# Patient Record
Sex: Female | Born: 1980 | Race: Black or African American | Hispanic: No | Marital: Single | Smoking: Current some day smoker
Health system: Southern US, Community
[De-identification: ages and names within clinical notes are randomized; demographics above are authoritative.]

---

## 2017-07-21 ENCOUNTER — Other Ambulatory Visit: Payer: Self-pay

## 2017-07-21 ENCOUNTER — Emergency Department: Payer: Medicaid Other

## 2017-07-21 ENCOUNTER — Emergency Department
Admission: EM | Admit: 2017-07-21 | Discharge: 2017-07-22 | Disposition: A | Discharge status: Home / Self Care | Payer: Medicaid Other | Attending: Emergency Medicine | Admitting: Emergency Medicine

## 2017-07-21 DIAGNOSIS — N3000 Acute cystitis without hematuria: Secondary | ICD-10-CM | POA: Diagnosis not present

## 2017-07-21 DIAGNOSIS — W19XXXA Unspecified fall, initial encounter: Secondary | ICD-10-CM | POA: Insufficient documentation

## 2017-07-21 DIAGNOSIS — R930 Abnormal findings on diagnostic imaging of skull and head, not elsewhere classified: Secondary | ICD-10-CM | POA: Insufficient documentation

## 2017-07-21 DIAGNOSIS — Y999 Unspecified external cause status: Secondary | ICD-10-CM | POA: Insufficient documentation

## 2017-07-21 DIAGNOSIS — R531 Weakness: Secondary | ICD-10-CM | POA: Diagnosis present

## 2017-07-21 DIAGNOSIS — S161XXA Strain of muscle, fascia and tendon at neck level, initial encounter: Secondary | ICD-10-CM | POA: Diagnosis not present

## 2017-07-21 DIAGNOSIS — Y9389 Activity, other specified: Secondary | ICD-10-CM | POA: Insufficient documentation

## 2017-07-21 DIAGNOSIS — Y929 Unspecified place or not applicable: Secondary | ICD-10-CM | POA: Diagnosis not present

## 2017-07-21 DIAGNOSIS — T148XXA Other injury of unspecified body region, initial encounter: Secondary | ICD-10-CM

## 2017-07-21 LAB — CBC
HCT: 38.4 % (ref 35.0–47.0)
Hemoglobin: 12.6 g/dL (ref 12.0–16.0)
MCH: 28 pg (ref 26.0–34.0)
MCHC: 32.8 g/dL (ref 32.0–36.0)
MCV: 85.3 fL (ref 80.0–100.0)
PLATELETS: 283 10*3/uL (ref 150–440)
RBC: 4.5 MIL/uL (ref 3.80–5.20)
RDW: 13.7 % (ref 11.5–14.5)
WBC: 6.8 10*3/uL (ref 3.6–11.0)

## 2017-07-21 LAB — COMPREHENSIVE METABOLIC PANEL
ALBUMIN: 4.5 g/dL (ref 3.5–5.0)
ALT: 16 U/L (ref 14–54)
AST: 23 U/L (ref 15–41)
Alkaline Phosphatase: 81 U/L (ref 38–126)
Anion gap: 7 (ref 5–15)
BUN: 11 mg/dL (ref 6–20)
CO2: 26 mmol/L (ref 22–32)
CREATININE: 0.76 mg/dL (ref 0.44–1.00)
Calcium: 9.4 mg/dL (ref 8.9–10.3)
Chloride: 104 mmol/L (ref 101–111)
GFR calc Af Amer: >60 mL/min (ref >60)
GFR calc non Af Amer: >60 mL/min (ref >60)
Glucose, Bld: 105 mg/dL — ABNORMAL HIGH (ref 65–99)
POTASSIUM: 3.3 mmol/L — AB (ref 3.5–5.1)
SODIUM: 137 mmol/L (ref 135–145)
Total Bilirubin: 0.7 mg/dL (ref 0.3–1.2)
Total Protein: 8 g/dL (ref 6.5–8.1)

## 2017-07-21 LAB — PROTIME-INR
INR: 0.93
Prothrombin Time: 12.4 seconds (ref 11.4–15.2)

## 2017-07-21 LAB — DIFFERENTIAL
BASOS ABS: 0 10*3/uL (ref 0–0.1)
BASOS PCT: 1 %
EOS ABS: 0.4 10*3/uL (ref 0–0.7)
Eosinophils Relative: 6 %
Lymphocytes Relative: 42 %
Lymphs Abs: 2.8 10*3/uL (ref 1.0–3.6)
Monocytes Absolute: 0.5 10*3/uL (ref 0.2–0.9)
Monocytes Relative: 7 %
NEUTROS ABS: 3.1 10*3/uL (ref 1.4–6.5)
NEUTROS PCT: 44 %

## 2017-07-21 LAB — TROPONIN I: Troponin I: <0.03 ng/mL (ref <0.03)

## 2017-07-21 LAB — APTT: APTT: 34 s (ref 24–36)

## 2017-07-21 MED ORDER — CYCLOBENZAPRINE HCL 10 MG PO TABS
10.0000 mg | ORAL_TABLET | Freq: Once | ORAL | Status: AC
  Administered 2017-07-22: 10 mg via ORAL
  Filled 2017-07-21: qty 1

## 2017-07-21 MED ORDER — KETOROLAC TROMETHAMINE 30 MG/ML IJ SOLN
15.0000 mg | Freq: Once | INTRAMUSCULAR | Status: AC
  Administered 2017-07-22: 15 mg via INTRAVENOUS
  Filled 2017-07-21: qty 1

## 2017-07-21 NOTE — ED Provider Notes (Signed)
[facility] Emergency Department Provider Note  ____________________________________________   First MD Initiated Contact with Patient 07/21/17 2306     (approximate)  I have reviewed the triage vital signs and the nursing notes.   HISTORY  Chief Complaint Weakness   HPI Nicole Banks is a 37 y.o. female who presents to the emergency department with left neck pain and left arm "heaviness" that began earlier on today.  Yesterday she was playing with her children in the mind and she slipped and fell onto her left side.  She had sudden onset pain in her left neck that initially improved however several hours ago developed numbness in her left arm.  She denies headache.  She denies double vision or blurred vision.  She has no history of stroke.  Nothing seemed to make her symptoms better and they are clearly worse with movement.  No past medical history on file.  There are no active problems to display for this patient.     Prior to Admission medications   Medication Sig Start Date End Date Taking? Authorizing Provider  aspirin EC 81 MG tablet Take 1 tablet (81 mg total) by mouth daily. 07/22/17 07/22/18  Merrily Brittleifenbark, Carder Yin, MD  cyclobenzaprine (FLEXERIL) 10 MG tablet Take 1 tablet (10 mg total) by mouth 3 (three) times daily as needed for muscle spasms. 07/22/17   Merrily Brittleifenbark, Tayley Mudrick, MD  nitrofurantoin, macrocrystal-monohydrate, (MACROBID) 100 MG capsule Take 1 capsule (100 mg total) by mouth 2 (two) times daily for 5 days. 07/22/17 07/27/17  Merrily Brittleifenbark, Yared Susan, MD    Allergies Patient has no known allergies.  No family history on file.  Social History Social History   Tobacco Use  . Smoking status: Not on file  Substance Use Topics  . Alcohol use: Not on file  . Drug use: Not on file    Review of Systems Constitutional: No fever/chills Eyes: No visual changes. ENT: No sore throat. Cardiovascular: Denies chest pain. Respiratory: Denies shortness of  breath. Gastrointestinal: No abdominal pain.  No nausea, no vomiting.  No diarrhea.  No constipation. Genitourinary: Negative for dysuria. Musculoskeletal: Negative for back pain. Skin: Negative for rash. Neurological: Positive for numbness   ____________________________________________   PHYSICAL EXAM:  VITAL SIGNS: ED Triage Vitals  Enc Vitals Group     BP 07/21/17 2252 (!) 121/57     Pulse Rate 07/21/17 2252 67     Resp 07/21/17 2252 18     Temp 07/21/17 2252 98.4 F (36.9 C)     Temp Source 07/21/17 2252 Oral     SpO2 07/21/17 2252 100 %     Weight 07/21/17 2253 137 lb (62.1 kg)     Height 07/21/17 2253 5\' 11"  (1.803 m)     Head Circumference --      Peak Flow --      Pain Score 07/21/17 2253 5     Pain Loc --      Pain Edu? --      Excl. in GC? --     Constitutional: Alert and oriented x4 pleasant cooperative speaks in full clear sentences no diaphoresis Eyes: PERRL EOMI. Head: Atraumatic. Nose: No congestion/rhinnorhea. Mouth/Throat: No trismus Neck: No stridor.   Cardiovascular: Normal rate, regular rhythm. Grossly normal heart sounds.  Good peripheral circulation. Respiratory: Normal respiratory effort.  No retractions. Lungs CTAB and moving good air Gastrointestinal: Soft nontender Musculoskeletal: No lower extremity edema   Neurologic:  Normal speech and language. No gross focal neurologic deficits are appreciated.  Skin:  Skin is warm, dry and intact. No rash noted. Psychiatric: Mood and affect are normal. Speech and behavior are normal.    ____________________________________________   DIFFERENTIAL includes but not limited to  Stroke, TIA, dissection, musculoskeletal strain ____________________________________________   LABS (all labs ordered are listed, but only abnormal results are displayed)  Labs Reviewed  COMPREHENSIVE METABOLIC PANEL - Abnormal; Notable for the following components:      Result Value   Potassium 3.3 (*)    Glucose, Bld  105 (*)    All other components within normal limits  URINE DRUG SCREEN, QUALITATIVE ([facility] ONLY) - Abnormal; Notable for the following components:   Cocaine Metabolite,Ur [place] POSITIVE (*)    All other components within normal limits  URINALYSIS, ROUTINE W REFLEX MICROSCOPIC - Abnormal; Notable for the following components:   Color, Urine YELLOW (*)    APPearance CLOUDY (*)    Leukocytes, UA MODERATE (*)    Bacteria, UA FEW (*)    Squamous Epithelial / LPF 6-30 (*)    All other components within normal limits  ETHANOL  PROTIME-INR  APTT  CBC  DIFFERENTIAL  TROPONIN I  POCT PREGNANCY, URINE    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  Code stroke was called in triage and the patient's head CT is negative for acute pathology.  I discussed with the telemetry neurologist who feels the patient's symptoms are most likely peripheral.  Her symptoms are improved after nonsteroidals and muscle relaxant.  Interestingly her head CT does show what is probably a remote infarct so I do think it is reasonable to give her a daily baby aspirin and have her follow-up with neurology for further stroke workup.  She verbalizes understanding and agreement the plan.  Strict return precautions have been given.      ____________________________________________   FINAL CLINICAL IMPRESSION(S) / ED  DIAGNOSES  Final diagnoses:  Acute cystitis without hematuria  Muscle strain      NEW MEDICATIONS STARTED DURING THIS VISIT:  Discharge Medication List as of 07/22/2017 12:42 AM    START taking these medications   Details  aspirin EC 81 MG tablet Take 1 tablet (81 mg total) by mouth daily., Starting Wed 07/22/2017, Until Thu 07/22/2018, Print    cyclobenzaprine (FLEXERIL) 10 MG tablet Take 1 tablet (10 mg total) by mouth 3 (three) times daily as needed for muscle spasms., Starting Wed 07/22/2017, Print    nitrofurantoin, macrocrystal-monohydrate, (MACROBID) 100 MG capsule Take 1 capsule (100 mg total) by mouth 2 (two) times daily for 5 days., Starting Wed 07/22/2017, Until Mon 07/27/2017, Print         Note:  This document was prepared using Dragon voice recognition software and may include unintentional dictation errors.     Merrily Brittle, MD 07/23/17 [phone]

## 2017-07-21 NOTE — ED Notes (Signed)
Pt states she fell yesterday in the rain yest, pt reports she fell on her left side.

## 2017-07-21 NOTE — ED Notes (Signed)
Dr. Gaynelle AduZaman (neurologist) talking with pt at this time

## 2017-07-21 NOTE — Consult Note (Signed)
[facility] TeleNeurology Consult Services  Asked to see this patient in telemedicine consultation. Consultation was performed with assistance of ancillary/medical staff at bedside.  Comments: Last Known normal  1800 Door Time: 2244 [facility] Contacted: 2313 [facility] first log in: 2325 NIHSS assessment time: 2331 Call back time: 2331 Needle Time: no iv tpa  HPI:  [age] yof with no significant past medical hx presents with left neck tenderness and left arm heaviness and discomfort. She was playing with her kids and slipped in the rain and fell on her knees and hands.  She denies any head or neck pain but states she fell on her left side.  She denies any weakness in her legs but states her arm feels odd.  She underwent ct scan head which was negative for acute process.   VSS  Gen Wn/Wd in Nad  TeleStroke Assessment: LOC:   0 LOC questions:  0 LOC Commands :   0 Gaze : 0 Visual fields :  0  Facial movements : 0 Upper limb Motor  0 Lower limb Motor  0 Limb Coordination  - 0 Sensory -  - 0 Language -  0 Speech -   0 Neglect / extinction -  0  NIHSS Score: 0    IMPRESSION  Suspect possible cervical paraspinal muscle spasms with radiculopathy related to fall as sxs isolated to LUE, can't exclude stroke but by hx and exam less likely. ? L cereballar remote infarct, patient with no past hx of stroke  Medical Decision Making:   Patient is not candidate for alteplase due to non focal exam and sxs onset greater than 4.5hrs  Not an IR candidate as low clinical suspicion for LVO by neurologic assessment.   Recommendations: - Recommend trial with muscle relaxer and nsaids for symptomatic relief, if patient is feeling better would recommend neurology follow up as outpatient - Thank you for allowing us to participate in the care of your patient, if there are any questions please don't hesitate to contact us  Discussed plan of care with patient/hospital staff    Physician: Terrace ArabiaMohammed Fedora Knisely, DO   [facility]

## 2017-07-21 NOTE — ED Notes (Signed)
Per Dr. Gaynelle AduZaman states pt is not having a stroke, will follow up with EDP Rifenbark.

## 2017-07-21 NOTE — ED Triage Notes (Addendum)
Pt in with co left sided pain and weakness no hx of the same. States at 1800 today, also states "swallowing" was different. No deficits noted at this time but pt states her left arm "feels weak at this time". Spoke with Dr. Manson PasseyBrown and instructed to call code stroke.

## 2017-07-22 LAB — URINALYSIS, ROUTINE W REFLEX MICROSCOPIC
BILIRUBIN URINE: NEGATIVE
Glucose, UA: NEGATIVE mg/dL
Hgb urine dipstick: NEGATIVE
KETONES UR: NEGATIVE mg/dL
Nitrite: NEGATIVE
PH: 7 (ref 5.0–8.0)
PROTEIN: NEGATIVE mg/dL
Specific Gravity, Urine: 1.006 (ref 1.005–1.030)

## 2017-07-22 LAB — URINE DRUG SCREEN, QUALITATIVE (ARMC ONLY)
AMPHETAMINES, UR SCREEN: NOT DETECTED
BENZODIAZEPINE, UR SCRN: NOT DETECTED
Barbiturates, Ur Screen: NOT DETECTED
COCAINE METABOLITE, UR ~~LOC~~: POSITIVE — AB
Cannabinoid 50 Ng, Ur ~~LOC~~: NOT DETECTED
MDMA (Ecstasy)Ur Screen: NOT DETECTED
METHADONE SCREEN, URINE: NOT DETECTED
Opiate, Ur Screen: NOT DETECTED
PHENCYCLIDINE (PCP) UR S: NOT DETECTED
Tricyclic, Ur Screen: NOT DETECTED

## 2017-07-22 LAB — POCT PREGNANCY, URINE: Preg Test, Ur: NEGATIVE

## 2017-07-22 LAB — ETHANOL: Alcohol, Ethyl (B): <10 mg/dL (ref <10)

## 2017-07-22 MED ORDER — NITROFURANTOIN MONOHYD MACRO 100 MG PO CAPS: 100.0000 mg | ORAL_CAPSULE | Freq: Two times a day (BID) | ORAL | 0 refills | Status: AC

## 2017-07-22 MED ORDER — ASPIRIN 81 MG PO CHEW
324.0000 mg | CHEWABLE_TABLET | Freq: Once | ORAL | Status: AC
  Administered 2017-07-22: 324 mg via ORAL
  Filled 2017-07-22: qty 4

## 2017-07-22 MED ORDER — CYCLOBENZAPRINE HCL 10 MG PO TABS: 10.0000 mg | ORAL_TABLET | Freq: Three times a day (TID) | ORAL | 0 refills | Status: AC | PRN

## 2017-07-22 MED ORDER — NITROFURANTOIN MONOHYD MACRO 100 MG PO CAPS
100.0000 mg | ORAL_CAPSULE | Freq: Once | ORAL | Status: AC
  Administered 2017-07-22: 100 mg via ORAL
  Filled 2017-07-22: qty 1

## 2017-07-22 MED ORDER — ASPIRIN EC 81 MG PO TBEC: 81.0000 mg | DELAYED_RELEASE_TABLET | Freq: Every day | ORAL | 0 refills | Status: AC

## 2017-07-22 NOTE — ED Notes (Signed)
Per EDP, code stroke cancelled.

## 2017-07-22 NOTE — ED Notes (Signed)

## 2017-07-22 NOTE — Discharge Instructions (Signed)
Please use your muscle relaxants as needed for severe symptoms and make an appointment to follow-up with the neurologist for reevaluation.  Return to the emergency department immediately for any new or worsening symptoms such as numbness, weakness, slurred speech, or for any other issues whatsoever.  Please begin taking 81 mg of aspirin every single day.  It was a pleasure to take care of you today, and thank you for coming to our emergency department.  If you have any questions or concerns before leaving please ask the nurse to grab me and I'm more than happy to go through your aftercare instructions again.  If you were prescribed any opioid pain medication today such as Norco, Vicodin, Percocet, morphine, hydrocodone, or oxycodone please make sure you do not drive when you are taking this medication as it can alter your ability to drive safely.  If you have any concerns once you are home that you are not improving or are in fact getting worse before you can make it to your follow-up appointment, please do not hesitate to call 911 and come back for further evaluation.  Merrily BrittleNeil Mahira Petras, MD  Results for orders placed or performed during the hospital encounter of 07/21/17  Ethanol  Result Value Ref Range   Alcohol, Ethyl (B) <10 <10 mg/dL  Protime-INR  Result Value Ref Range   Prothrombin Time 12.4 11.4 - 15.2 seconds   INR 0.93   APTT  Result Value Ref Range   aPTT 34 24 - 36 seconds  CBC  Result Value Ref Range   WBC 6.8 3.6 - 11.0 K/uL   RBC 4.50 3.80 - 5.20 MIL/uL   Hemoglobin 12.6 12.0 - 16.0 g/dL   HCT 40.938.4 81.135.0 - 91.447.0 %   MCV 85.3 80.0 - 100.0 fL   MCH 28.0 26.0 - 34.0 pg   MCHC 32.8 32.0 - 36.0 g/dL   RDW 78.213.7 95.611.5 - 21.314.5 %   Platelets 283 150 - 440 K/uL  Differential  Result Value Ref Range   Neutrophils Relative % 44 %   Neutro Abs 3.1 1.4 - 6.5 K/uL   Lymphocytes Relative 42 %   Lymphs Abs 2.8 1.0 - 3.6 K/uL   Monocytes Relative 7 %   Monocytes Absolute 0.5 0.2 - 0.9  K/uL   Eosinophils Relative 6 %   Eosinophils Absolute 0.4 0 - 0.7 K/uL   Basophils Relative 1 %   Basophils Absolute 0.0 0 - 0.1 K/uL  Comprehensive metabolic panel  Result Value Ref Range   Sodium 137 135 - 145 mmol/L   Potassium 3.3 (L) 3.5 - 5.1 mmol/L   Chloride 104 101 - 111 mmol/L   CO2 26 22 - 32 mmol/L   Glucose, Bld 105 (H) 65 - 99 mg/dL   BUN 11 6 - 20 mg/dL   Creatinine, Ser 0.860.76 0.44 - 1.00 mg/dL   Calcium 9.4 8.9 - 57.810.3 mg/dL   Total Protein 8.0 6.5 - 8.1 g/dL   Albumin 4.5 3.5 - 5.0 g/dL   AST 23 15 - 41 U/L   ALT 16 14 - 54 U/L   Alkaline Phosphatase 81 38 - 126 U/L   Total Bilirubin 0.7 0.3 - 1.2 mg/dL   GFR calc non Af Amer >60 >60 mL/min   GFR calc Af Amer >60 >60 mL/min   Anion gap 7 5 - 15  Troponin I  Result Value Ref Range   Troponin I <0.03 <0.03 ng/mL  Urine Drug Screen, Qualitative  Result Value Ref  Range   Tricyclic, Ur Screen NONE DETECTED NONE DETECTED   Amphetamines, Ur Screen NONE DETECTED NONE DETECTED   MDMA (Ecstasy)Ur Screen NONE DETECTED NONE DETECTED   Cocaine Metabolite,Ur  POSITIVE (A) NONE DETECTED   Opiate, Ur Screen NONE DETECTED NONE DETECTED   Phencyclidine (PCP) Ur S NONE DETECTED NONE DETECTED   Cannabinoid 50 Ng, Ur  NONE DETECTED NONE DETECTED   Barbiturates, Ur Screen NONE DETECTED NONE DETECTED   Benzodiazepine, Ur Scrn NONE DETECTED NONE DETECTED   Methadone Scn, Ur NONE DETECTED NONE DETECTED  Urinalysis, Routine w reflex microscopic  Result Value Ref Range   Color, Urine YELLOW (A) YELLOW   APPearance CLOUDY (A) CLEAR   Specific Gravity, Urine 1.006 1.005 - 1.030   pH 7.0 5.0 - 8.0   Glucose, UA NEGATIVE NEGATIVE mg/dL   Hgb urine dipstick NEGATIVE NEGATIVE   Bilirubin Urine NEGATIVE NEGATIVE   Ketones, ur NEGATIVE NEGATIVE mg/dL   Protein, ur NEGATIVE NEGATIVE mg/dL   Nitrite NEGATIVE NEGATIVE   Leukocytes, UA MODERATE (A) NEGATIVE   RBC / HPF 6-30 0 - 5 RBC/hpf   WBC, UA 6-30 0 - 5 WBC/hpf   Bacteria, UA  FEW (A) NONE SEEN   Squamous Epithelial / LPF 6-30 (A) NONE SEEN   WBC Clumps PRESENT   Pregnancy, urine POC  Result Value Ref Range   Preg Test, Ur NEGATIVE NEGATIVE   Ct Head Code Stroke Wo Contrast`  Result Date: 07/21/2017 CLINICAL DATA:  Code stroke. Initial evaluation for acute left-sided weakness. EXAM: CT HEAD WITHOUT CONTRAST TECHNIQUE: Contiguous axial images were obtained from the base of the skull through the vertex without intravenous contrast. COMPARISON:  None. FINDINGS: Brain: Cerebral volume within normal limits. No acute intracranial hemorrhage. No acute large vessel territory infarct. Small remote left cerebellar infarct noted. No mass lesion, midline shift or mass effect. No hydrocephalus. No extra-axial fluid collection. Vascular: No hyperdense vessel. Skull: Scalp soft tissues and calvarium within normal limits. Sinuses/Orbits: Globes and orbital soft tissues normal. Paranasal sinuses and mastoid air cells are clear. Other: None. ASPECTS [facility] Stroke Program Early CT Score) - Ganglionic level infarction (caudate, lentiform nuclei, internal capsule, insula, M1-M3 cortex): 7 - Supraganglionic infarction (M4-M6 cortex): 3 Total score (0-10 with 10 being normal): 10 IMPRESSION: 1. No acute intracranial infarct or other abnormality identified. 2. ASPECTS is 10. 3. Small remote left cerebellar infarct. Critical Value/emergent results were called by telephone at the time of interpretation on 07/21/2017 at 11:18 pm to Dr. Bayard Males , who verbally acknowledged these results. Electronically Signed   By: Rise Mu M.D.   On: 07/21/2017 23:19

## 2017-08-08 ENCOUNTER — Emergency Department
Admission: EM | Admit: 2017-08-08 | Discharge: 2017-08-08 | Disposition: A | Discharge status: Home / Self Care | Payer: Medicaid Other | Attending: Emergency Medicine | Admitting: Emergency Medicine

## 2017-08-08 ENCOUNTER — Other Ambulatory Visit: Payer: Self-pay

## 2017-08-08 ENCOUNTER — Encounter: Payer: Self-pay | Admitting: Emergency Medicine

## 2017-08-08 ENCOUNTER — Emergency Department: Payer: Medicaid Other

## 2017-08-08 DIAGNOSIS — S93602A Unspecified sprain of left foot, initial encounter: Secondary | ICD-10-CM | POA: Insufficient documentation

## 2017-08-08 DIAGNOSIS — Z79899 Other long term (current) drug therapy: Secondary | ICD-10-CM | POA: Insufficient documentation

## 2017-08-08 DIAGNOSIS — Y999 Unspecified external cause status: Secondary | ICD-10-CM | POA: Insufficient documentation

## 2017-08-08 DIAGNOSIS — Z7982 Long term (current) use of aspirin: Secondary | ICD-10-CM | POA: Diagnosis not present

## 2017-08-08 DIAGNOSIS — Y939 Activity, unspecified: Secondary | ICD-10-CM | POA: Diagnosis not present

## 2017-08-08 DIAGNOSIS — S99922A Unspecified injury of left foot, initial encounter: Secondary | ICD-10-CM | POA: Diagnosis present

## 2017-08-08 DIAGNOSIS — X501XXA Overexertion from prolonged static or awkward postures, initial encounter: Secondary | ICD-10-CM | POA: Diagnosis not present

## 2017-08-08 DIAGNOSIS — Y929 Unspecified place or not applicable: Secondary | ICD-10-CM | POA: Insufficient documentation

## 2017-08-08 MED ORDER — MELOXICAM 15 MG PO TABS: 15.0000 mg | ORAL_TABLET | Freq: Every day | ORAL | 0 refills | Status: AC

## 2017-08-08 MED ORDER — OXYCODONE-ACETAMINOPHEN 5-325 MG PO TABS
1.0000 | ORAL_TABLET | Freq: Once | ORAL | Status: AC
  Administered 2017-08-08: 1 via ORAL
  Filled 2017-08-08: qty 1

## 2017-08-08 NOTE — ED Notes (Signed)
See triage note  Presents with pain to left foot   States she was getting up from her bed  Her foot bent   States she did not twist her ankle but bent her toes back  Positive pulses  With some swelling

## 2017-08-08 NOTE — ED Provider Notes (Signed)
[facility] Emergency Department Provider Note  ____________________________________________  Time seen: Approximately 4:31 PM  I have reviewed the triage vital signs and the nursing notes.   HISTORY  Chief Complaint Foot Injury    HPI Nicole Banks is a 37 y.o. female who presents emergency department complaining of left foot pain.  Patient reports that she was getting out of bed, managed to bend the distal aspect of her foot underneath her as she was stepping out of bed.  Patient reports that she has had pain and swelling to the distal half of her left foot.  Area is painful to the point of causing difficulty with ambulation.  She has not tried any medications for this complaint prior to arrival.  No other complaints at this time.  No history of previous ankle or foot injury.  History reviewed. No pertinent past medical history.  There are no active problems to display for this patient.   History reviewed. No pertinent surgical history.  Prior to Admission medications   Medication Sig Start Date End Date Taking? Authorizing Provider  aspirin EC 81 MG tablet Take 1 tablet (81 mg total) by mouth daily. 07/22/17 07/22/18  Merrily Brittle, MD  cyclobenzaprine (FLEXERIL) 10 MG tablet Take 1 tablet (10 mg total) by mouth 3 (three) times daily as needed for muscle spasms. 07/22/17   Merrily Brittle, MD  meloxicam (MOBIC) 15 MG tablet Take 1 tablet (15 mg total) by mouth daily. 08/08/17   Jerred Zaremba, Delorise Royals, PA-C    Allergies Patient has no known allergies.  No family history on file.  Social History Social History   Tobacco Use  . Smoking status: Not on file  Substance Use Topics  . Alcohol use: Not on file  . Drug use: Not on file     Review of Systems  Constitutional: No fever/chills Eyes: No visual changes. No discharge ENT: No upper respiratory complaints. Cardiovascular: no chest pain. Respiratory: no cough. No SOB. Gastrointestinal: No  abdominal pain.  No nausea, no vomiting.  Musculoskeletal: Positive for left foot injury/pain Skin: Negative for rash, abrasions, lacerations, ecchymosis. Neurological: Negative for headaches, focal weakness or numbness. 10-point ROS otherwise negative.  ____________________________________________   PHYSICAL EXAM:  VITAL SIGNS: ED Triage Vitals [08/08/17 1534]  Enc Vitals Group     BP 129/67     Pulse Rate 96     Resp 18     Temp 98.2 F (36.8 C)     Temp Source Oral     SpO2 98 %     Weight 137 lb (62.1 kg)     Height  (1.803 m)     Head Circumference      Peak Flow      Pain Score 8     Pain Loc      Pain Edu?      Excl. in GC?      Constitutional: Alert and oriented. Well appearing and in no acute distress. Eyes: Conjunctivae are normal. PERRL. EOMI. Head: Atraumatic. Neck: No stridor.    Cardiovascular: Normal rate, regular rhythm. Normal S1 and S2.  Good peripheral circulation. Respiratory: Normal respiratory effort without tachypnea or retractions. Lungs CTAB. Good air entry to the bases with no decreased or absent breath sounds. Musculoskeletal: Full range of motion to all extremities. No gross deformities appreciated.  No gross deformity, edema, erythema, lacerations or abrasions on the left foot.  Patient has very tender to palpation diffusely over the distal aspect of the foot  with no specific point tenderness.  Tenderness includes all 5 metatarsals, all 5 phalanxes.  No palpable abnormality.  Dorsalis pedis pulse intact.  Sensation capillary refill intact all 5 digits. Neurologic:  Normal speech and language. No gross focal neurologic deficits are appreciated.  Skin:  Skin is warm, dry and intact. No rash noted. Psychiatric: Mood and affect are normal. Speech and behavior are normal. Patient exhibits appropriate insight and judgement.   ____________________________________________   LABS (all labs ordered are listed, but only abnormal results are  displayed)  Labs Reviewed - No data to display ____________________________________________  EKG   ____________________________________________  RADIOLOGY [name], personally viewed and evaluated these images (plain radiographs) as part of my medical decision making, as well as reviewing the written report by the radiologist.  Dg Foot Complete Left  Result Date: 08/08/2017 CLINICAL DATA:  Left foot pain after injury getting out of bed this morning. Initial encounter. EXAM: LEFT FOOT - COMPLETE 3+ VIEW COMPARISON:  None. FINDINGS: There is no evidence of fracture or dislocation. There is no evidence of arthropathy or other focal bone abnormality. Soft tissues are unremarkable. IMPRESSION: Negative. Electronically Signed   By: Marnee Spring M.D.   On: 08/08/2017 17:00    ____________________________________________    PROCEDURES  Procedure(s) performed:    Procedures    Medications  oxyCODONE-acetaminophen (PERCOCET/ROXICET) 5-325 MG per tablet 1 tablet (has no administration in time range)     ____________________________________________   INITIAL IMPRESSION / ASSESSMENT AND PLAN / ED COURSE  Pertinent labs & imaging results that were available during my care of the patient were reviewed by me and considered in my medical decision making (see chart for details).  Review of the [place] CSRS was performed in accordance of the [facility] prior to dispensing any controlled drugs.     Patient's diagnosis is consistent with left foot sprain.  Patient presents with pain to the left foot after injury this morning.  Exam is reassuring.  X-ray reveals no acute osseous abnormality.  No indication of ligament rupture.  No indication for further work-up.  She was given Ace bandage and crutches in the emergency department.. Patient will be discharged home with prescriptions for meloxicam. Patient is to follow up with primary care as needed or otherwise directed. Patient is  given ED precautions to return to the ED for any worsening or new symptoms.     ____________________________________________  FINAL CLINICAL IMPRESSION(S) / ED DIAGNOSES  Final diagnoses:  Sprain of left foot, initial encounter      NEW MEDICATIONS STARTED DURING THIS VISIT:  ED Discharge Orders        Ordered    meloxicam (MOBIC) 15 MG tablet  Daily     08/08/17 1742          This chart was dictated using voice recognition software/Dragon. Despite best efforts to proofread, errors can occur which can change the meaning. Any change was purely unintentional.    Racheal Patches, PA-C 08/08/17 1742    Arnaldo Natal, MD 08/13/17 1314

## 2017-08-08 NOTE — ED Triage Notes (Signed)
L foot pain since twisted under her during night.

## 2018-01-12 LAB — HIV ANTIBODY (ROUTINE TESTING W REFLEX): HIV: NONREACTIVE

## 2018-01-12 LAB — HM HIV SCREENING LAB: HM HIV Screening: NEGATIVE

## 2018-08-31 ENCOUNTER — Encounter: Payer: Self-pay | Admitting: Emergency Medicine

## 2018-08-31 ENCOUNTER — Emergency Department
Admission: EM | Admit: 2018-08-31 | Discharge: 2018-08-31 | Disposition: A | Discharge status: Home / Self Care | Payer: Medicaid Other | Attending: Emergency Medicine | Admitting: Emergency Medicine

## 2018-08-31 ENCOUNTER — Other Ambulatory Visit: Payer: Self-pay

## 2018-08-31 DIAGNOSIS — S61412A Laceration without foreign body of left hand, initial encounter: Secondary | ICD-10-CM | POA: Insufficient documentation

## 2018-08-31 DIAGNOSIS — S61512A Laceration without foreign body of left wrist, initial encounter: Secondary | ICD-10-CM | POA: Diagnosis not present

## 2018-08-31 DIAGNOSIS — Y999 Unspecified external cause status: Secondary | ICD-10-CM | POA: Diagnosis not present

## 2018-08-31 DIAGNOSIS — Z79899 Other long term (current) drug therapy: Secondary | ICD-10-CM | POA: Insufficient documentation

## 2018-08-31 DIAGNOSIS — W25XXXA Contact with sharp glass, initial encounter: Secondary | ICD-10-CM | POA: Diagnosis not present

## 2018-08-31 DIAGNOSIS — Y92029 Unspecified place in mobile home as the place of occurrence of the external cause: Secondary | ICD-10-CM | POA: Diagnosis not present

## 2018-08-31 DIAGNOSIS — Y9389 Activity, other specified: Secondary | ICD-10-CM | POA: Insufficient documentation

## 2018-08-31 DIAGNOSIS — F17210 Nicotine dependence, cigarettes, uncomplicated: Secondary | ICD-10-CM | POA: Insufficient documentation

## 2018-08-31 MED ORDER — IBUPROFEN 600 MG PO TABS: 600.0000 mg | ORAL_TABLET | Freq: Four times a day (QID) | ORAL | 0 refills | Status: AC | PRN

## 2018-08-31 MED ORDER — LIDOCAINE HCL (PF) 1 % IJ SOLN
INTRAMUSCULAR | Status: AC
  Administered 2018-08-31: 5 mL
  Filled 2018-08-31: qty 5

## 2018-08-31 MED ORDER — LIDOCAINE HCL (PF) 1 % IJ SOLN
5.0000 mL | Freq: Once | INTRAMUSCULAR | Status: AC
  Administered 2018-08-31: 5 mL

## 2018-08-31 MED ORDER — OXYCODONE-ACETAMINOPHEN 5-325 MG PO TABS
1.0000 | ORAL_TABLET | Freq: Once | ORAL | Status: AC
  Administered 2018-08-31: 04:00:00 1 via ORAL
  Filled 2018-08-31: qty 1

## 2018-08-31 NOTE — ED Triage Notes (Addendum)
Patient ambulatory to triage with steady gait, without difficulty, tearful, mask in place; +ETOH; pt reports was "trying to kill a brown recluse spider, hit the mirror and cut hand"; lac to left inner wrist and base of left middle finger with small amount bleeding; silver tone band removed from middle finger and placed in container and given back to pt; gauze dressing applied

## 2018-08-31 NOTE — Discharge Instructions (Addendum)
Keep laceration dry and clean. Wash with warm water and soap. Apply topical bacitracin. Protect from the sun to minimize scarring. You received 7 stitches that must be removed in 7 days.  Watch for signs of infection: pus, redness of the skin surrounding it, or fever. If these develop see your doctor or return to the ER for antibiotics.

## 2018-08-31 NOTE — ED Provider Notes (Signed)
[facility] Emergency Department Provider Note  ____________________________________________  Time seen: Approximately 3:49 AM  I have reviewed the triage vital signs and the nursing notes.   HISTORY  Chief Complaint Laceration   HPI Nicole Banks is a 38 y.o. female who presents for evaluation of laceration.  Patient reports that she saw a brown recluse spider inside her trailer home.  The spider was over a mirror.  She reports hitting the mirror with her hand.  The mirror broke and cut her left wrist and palm of her left hand.  She is complaining of severe sharp constant pain since this happened.  Last tetanus shot was a year ago.  No other injuries.  Patient was not bitten by the spider.  PMH None - reviewed  Past Surgical History:  Procedure Laterality Date  . CESAREAN SECTION     x 2 deliveries    Prior to Admission medications   Medication Sig Start Date End Date Taking? Authorizing Provider  cyclobenzaprine (FLEXERIL) 10 MG tablet Take 1 tablet (10 mg total) by mouth 3 (three) times daily as needed for muscle spasms. 07/22/17   Merrily Brittleifenbark, Neil, MD  ibuprofen (ADVIL) 600 MG tablet Take 1 tablet (600 mg total) by mouth every 6 (six) hours as needed. 08/31/18   Nita SickleVeronese, [place], MD  meloxicam (MOBIC) 15 MG tablet Take 1 tablet (15 mg total) by mouth daily. 08/08/17   Cuthriell, Delorise RoyalsJonathan D, PA-C    Allergies Patient has no known allergies.  No family history on file.  Social History Social History   Tobacco Use  . Smoking status: Current Some Day Smoker  . Smokeless tobacco: Never Used  Substance Use Topics  . Alcohol use: Not on file  . Drug use: Not on file    Review of Systems  Constitutional: Negative for fever. Eyes: Negative for visual changes. ENT: Negative for sore throat. Neck: No neck pain  Cardiovascular: Negative for chest pain. Respiratory: Negative for shortness of breath. Gastrointestinal: Negative for abdominal  pain, vomiting or diarrhea. Genitourinary: Negative for dysuria. Musculoskeletal: Negative for back pain. Skin: Negative for rash. + L wrist and hand lacerattion Neurological: Negative for headaches, weakness or numbness. Psych: No SI or HI  ____________________________________________   PHYSICAL EXAM:  VITAL SIGNS: ED Triage Vitals  Enc Vitals Group     BP 08/31/18 0158 (!) 132/91     Pulse Rate 08/31/18 0158 (!) 119     Resp 08/31/18 0158 20     Temp 08/31/18 0158 98.3 F (36.8 C)     Temp Source 08/31/18 0158 Oral     SpO2 08/31/18 0158 98 %     Weight 08/31/18 0157 137 lb (62.1 kg)     Height 08/31/18 0157 5\' 11"  (1.803 m)     Head Circumference --      Peak Flow --      Pain Score 08/31/18 0157 10     Pain Loc --      Pain Edu? --      Excl. in GC? --     Constitutional: Alert and oriented, anxious, pacing up and down the room, mildly intoxicated.  HEENT:      Head: Normocephalic and atraumatic.         Eyes: Conjunctivae are normal. Sclera is non-icteric.       Mouth/Throat: Mucous membranes are moist.       Neck: Supple with no signs of meningismus. Cardiovascular: Regular rate and rhythm. No murmurs, gallops, or  rubs. 2+ symmetrical distal pulses are present in all extremities. No JVD. Respiratory: Normal respiratory effort. Lungs are clear to auscultation bilaterally. No wheezes, crackles, or rhonchi.  Musculoskeletal: Superficial 2 cm L dorsal wrist laceration and superficial 2cm left palm laceration.  Neurovascularly intact, no foreign bodies, no tendon injury, no arterial bleed Neurologic: Normal speech and language. Face is symmetric. Moving all extremities. No gross focal neurologic deficits are appreciated. Skin: Skin is warm, dry and intact. No rash noted. Psychiatric: Mood and affect are normal. Speech and behavior are normal.  ____________________________________________   LABS (all labs ordered are listed, but only abnormal results are displayed)   Labs Reviewed - No data to display ____________________________________________  EKG  none  ____________________________________________  RADIOLOGY  none  ____________________________________________   PROCEDURES  Procedure(s) performed: yes .Marland KitchenLaceration Repair Date/Time: 08/31/2018 3:55 AM Performed by: Nita Sickle, MD Authorized by: Nita Sickle, MD   Consent:    Consent obtained:  Verbal   Consent given by:  Patient   Risks discussed:  Infection, pain, retained foreign body, poor cosmetic result and poor wound healing Anesthesia (see MAR for exact dosages):    Anesthesia method:  Local infiltration   Local anesthetic:  Lidocaine 1% w/o epi Laceration details:    Location:  Hand   Hand location:  L palm   Length (cm):  2 Repair type:    Repair type:  Simple Exploration:    Hemostasis achieved with:  Direct pressure   Wound exploration: entire depth of wound probed and visualized     Wound extent: no foreign bodies/material noted, no nerve damage noted, no tendon damage noted, no underlying fracture noted and no vascular damage noted     Contaminated: no   Treatment:    Area cleansed with:  Saline   Amount of cleaning:  Extensive   Irrigation solution:  Sterile saline   Visualized foreign bodies/material removed: no   Skin repair:    Repair method:  Sutures   Suture size:  6-0   Suture material:  Nylon   Suture technique:  Simple interrupted   Number of sutures:  4 Approximation:    Approximation:  Close Post-procedure details:    Dressing:  Sterile dressing   Patient tolerance of procedure:  Tolerated well, no immediate complications .Marland KitchenLaceration Repair Date/Time: 08/31/2018 3:56 AM Performed by: Nita Sickle, MD Authorized by: Nita Sickle, MD   Consent:    Consent obtained:  Verbal   Consent given by:  Patient   Risks discussed:  Infection, pain, retained foreign body, poor cosmetic result and poor wound healing Anesthesia  (see MAR for exact dosages):    Anesthesia method:  Local infiltration   Local anesthetic:  Lidocaine 1% w/o epi Laceration details:    Location:  Hand   Hand location:  L wrist   Length (cm):  2 Repair type:    Repair type:  Simple Exploration:    Hemostasis achieved with:  Direct pressure   Wound exploration: entire depth of wound probed and visualized     Wound extent: no foreign bodies/material noted, no tendon damage noted, no underlying fracture noted and no vascular damage noted     Contaminated: no   Treatment:    Area cleansed with:  Saline   Amount of cleaning:  Extensive   Irrigation solution:  Sterile saline   Visualized foreign bodies/material removed: no   Skin repair:    Repair method:  Sutures   Suture size:  6-0   Suture material:  Nylon   Suture technique:  Simple interrupted   Number of sutures:  3 Approximation:    Approximation:  Close Post-procedure details:    Dressing:  Sterile dressing   Patient tolerance of procedure:  Tolerated well, no immediate complications   Critical Care performed:  None ____________________________________________   INITIAL IMPRESSION / ASSESSMENT AND PLAN / ED COURSE   38 y.o. female who presents for evaluation of laceration.  Patient with 2 x 2 cm lacerations in the left wrist and left palm which were repaired per procedure note above.  No foreign bodies.  Laceration superficial with no tendon or vasculature involvement.  No fractures.  Tetanus up-to-date.  Discussed wound care with patient and follow-up for stitch removal.      As part of my medical decision making, I reviewed the following data within the electronic MEDICAL RECORD NUMBER Nursing notes reviewed and incorporated, Old chart reviewed, Notes from prior ED visits and  Controlled Substance Database    Pertinent labs & imaging results that were available during my care of the patient were reviewed by me and considered in my medical decision making (see chart  for details).    ____________________________________________   FINAL CLINICAL IMPRESSION(S) / ED DIAGNOSES  Final diagnoses:  Laceration of left wrist, initial encounter  Laceration of left hand without foreign body, initial encounter      NEW MEDICATIONS STARTED DURING THIS VISIT:  ED Discharge Orders         Ordered    ibuprofen (ADVIL) 600 MG tablet  Every 6 hours PRN     08/31/18 0348           Note:  This document was prepared using Dragon voice recognition software and may include unintentional dictation errors.    Don Perking, [place], MD 08/31/18 [phone]

## 2019-01-09 ENCOUNTER — Emergency Department
Admission: EM | Admit: 2019-01-09 | Discharge: 2019-01-10 | Disposition: A | Discharge status: Home / Self Care | Payer: Medicaid Other | Attending: Emergency Medicine | Admitting: Emergency Medicine

## 2019-01-09 ENCOUNTER — Emergency Department: Payer: Medicaid Other

## 2019-01-09 ENCOUNTER — Other Ambulatory Visit: Payer: Self-pay

## 2019-01-09 DIAGNOSIS — R0602 Shortness of breath: Secondary | ICD-10-CM | POA: Insufficient documentation

## 2019-01-09 DIAGNOSIS — R0789 Other chest pain: Secondary | ICD-10-CM | POA: Insufficient documentation

## 2019-01-09 DIAGNOSIS — R079 Chest pain, unspecified: Secondary | ICD-10-CM

## 2019-01-09 DIAGNOSIS — F17200 Nicotine dependence, unspecified, uncomplicated: Secondary | ICD-10-CM | POA: Insufficient documentation

## 2019-01-09 DIAGNOSIS — Z79899 Other long term (current) drug therapy: Secondary | ICD-10-CM | POA: Diagnosis not present

## 2019-01-09 LAB — CBC
HCT: 36 % (ref 36.0–46.0)
Hemoglobin: 11.8 g/dL — ABNORMAL LOW (ref 12.0–15.0)
MCH: 28.3 pg (ref 26.0–34.0)
MCHC: 32.8 g/dL (ref 30.0–36.0)
MCV: 86.3 fL (ref 80.0–100.0)
Platelets: 241 10*3/uL (ref 150–400)
RBC: 4.17 MIL/uL (ref 3.87–5.11)
RDW: 13.2 % (ref 11.5–15.5)
WBC: 7.4 10*3/uL (ref 4.0–10.5)
nRBC: 0 % (ref 0.0–0.2)

## 2019-01-09 LAB — COMPREHENSIVE METABOLIC PANEL
ALT: 17 U/L (ref 0–44)
AST: 21 U/L (ref 15–41)
Albumin: 4.2 g/dL (ref 3.5–5.0)
Alkaline Phosphatase: 67 U/L (ref 38–126)
Anion gap: 10 (ref 5–15)
BUN: 12 mg/dL (ref 6–20)
CO2: 24 mmol/L (ref 22–32)
Calcium: 9 mg/dL (ref 8.9–10.3)
Chloride: 104 mmol/L (ref 98–111)
Creatinine, Ser: 0.66 mg/dL (ref 0.44–1.00)
GFR calc Af Amer: >60 mL/min (ref >60)
GFR calc non Af Amer: >60 mL/min (ref >60)
Glucose, Bld: 86 mg/dL (ref 70–99)
Potassium: 3.5 mmol/L (ref 3.5–5.1)
Sodium: 138 mmol/L (ref 135–145)
Total Bilirubin: 0.5 mg/dL (ref 0.3–1.2)
Total Protein: 7.2 g/dL (ref 6.5–8.1)

## 2019-01-09 LAB — TROPONIN I (HIGH SENSITIVITY)
Troponin I (High Sensitivity): 5 ng/L (ref <18)
Troponin I (High Sensitivity): 5 ng/L (ref <18)

## 2019-01-09 LAB — PREGNANCY, URINE: Preg Test, Ur: NEGATIVE

## 2019-01-09 MED ORDER — FAMOTIDINE 20 MG PO TABS: 20.0000 mg | ORAL_TABLET | Freq: Every day | ORAL | 1 refills | Status: AC

## 2019-01-09 MED ORDER — LIDOCAINE VISCOUS HCL 2 % MT SOLN
15.0000 mL | Freq: Once | OROMUCOSAL | Status: AC
  Administered 2019-01-09: 23:00:00 15 mL via ORAL
  Filled 2019-01-09: qty 15

## 2019-01-09 MED ORDER — ALUM & MAG HYDROXIDE-SIMETH 200-200-20 MG/5ML PO SUSP
30.0000 mL | Freq: Once | ORAL | Status: AC
  Administered 2019-01-09: 23:00:00 30 mL via ORAL
  Filled 2019-01-09: qty 30

## 2019-01-09 NOTE — ED Triage Notes (Signed)
Pt states she began to have left sided chest pain radiating to left arm. Pt states she has been slightly dizzy, but denies shob. Pt states she had a panic attack prior to onset of pain.

## 2019-01-09 NOTE — ED Provider Notes (Signed)
[facility] Emergency Department Provider Note    ____________________________________________   I have reviewed the triage vital signs and the nursing notes.   HISTORY  Chief Complaint Chest Pain   History limited by: Not Limited   HPI Nicole Banks is a 38 y.o. female who presents to the emergency department today because of concerns for chest pain.  She states it was located in the left side of her chest.  She described as a squeezing sensation.  She does have history of panic attacks and has had similar chest pain in the past although it has never lasted as long as long as it did today.  She did notice some associated tingling in her fingertips and some shortness of breath.  She states she did drink alcohol and use some cocaine last night.  Records reviewed. Per medical record review patient has a history of cesarean section  No past medical history on file.  There are no active problems to display for this patient.   Past Surgical History:  Procedure Laterality Date  . CESAREAN SECTION     x 2 deliveries    Prior to Admission medications   Medication Sig Start Date End Date Taking? Authorizing Provider  cyclobenzaprine (FLEXERIL) 10 MG tablet Take 1 tablet (10 mg total) by mouth 3 (three) times daily as needed for muscle spasms. 07/22/17   Merrily Brittle, MD  ibuprofen (ADVIL) 600 MG tablet Take 1 tablet (600 mg total) by mouth every 6 (six) hours as needed. 08/31/18   Nita Sickle, MD  meloxicam (MOBIC) 15 MG tablet Take 1 tablet (15 mg total) by mouth daily. 08/08/17   Cuthriell, Delorise Royals, PA-C    Allergies Patient has no known allergies.  No family history on file.  Social History Social History   Tobacco Use  . Smoking status: Current Some Day Smoker  . Smokeless tobacco: Never Used  Substance Use Topics  . Alcohol use: Not on file  . Drug use: Not on file    Review of Systems Constitutional: No fever/chills Eyes: No  visual changes. ENT: No sore throat. Cardiovascular: Positive for chest pain. Respiratory: Positive for shortness of breath. Gastrointestinal: No abdominal pain.  No nausea, no vomiting.  No diarrhea.   Genitourinary: Negative for dysuria. Musculoskeletal: Negative for back pain. Skin: Negative for rash. Neurological: Positive for hand tingling.   ____________________________________________   PHYSICAL EXAM:  VITAL SIGNS: ED Triage Vitals  Enc Vitals Group     BP 01/09/19 1916 112/78     Pulse Rate 01/09/19 1916 65     Resp 01/09/19 1916 20     Temp 01/09/19 1916 98.8 F (37.1 C)     Temp Source 01/09/19 1916 Oral     SpO2 01/09/19 1916 97 %     Weight 01/09/19 1917 130 lb (59 kg)     Height 01/09/19 1917 5\' 11"  (1.803 m)     Head Circumference --      Peak Flow --      Pain Score 01/09/19 1921 4   Constitutional: Alert and oriented.  Eyes: Conjunctivae are normal.  ENT      Head: Normocephalic and atraumatic.      Nose: No congestion/rhinnorhea.      Mouth/Throat: Mucous membranes are moist.      Neck: No stridor. Hematological/Lymphatic/Immunilogical: No cervical lymphadenopathy. Cardiovascular: Normal rate, regular rhythm.  No murmurs, rubs, or gallops.  Respiratory: Normal respiratory effort without tachypnea nor retractions. Breath sounds are clear and  equal bilaterally. No wheezes/rales/rhonchi. Gastrointestinal: Soft and non tender. No rebound. No guarding.  Genitourinary: Deferred Musculoskeletal: Normal range of motion in all extremities. No lower extremity edema. Neurologic:  Normal speech and language. No gross focal neurologic deficits are appreciated.  Skin:  Skin is warm, dry and intact. No rash noted. Psychiatric: Mood and affect are normal. Speech and behavior are normal. Patient exhibits appropriate insight and judgment.  ____________________________________________    LABS (pertinent positives/negatives)  Trop 5 CMP wnl CBC wbc 7.4, hgb 11.8,  plt 241  ____________________________________________   EKG  I, Nance Pear, attending physician, personally viewed and interpreted this EKG  EKG Time: 1910 Rate: 66 Rhythm: ectopic atrial rhythm Axis: normal Intervals: qtc 404 QRS: narrow ST changes: no st elevation Impression: abnormal ekg   ____________________________________________    RADIOLOGY  CXR No acute abnormality  ____________________________________________   PROCEDURES  Procedures  ____________________________________________   INITIAL IMPRESSION / ASSESSMENT AND PLAN / ED COURSE  Pertinent labs & imaging results that were available during my care of the patient were reviewed by me and considered in my medical decision making (see chart for details).   Patient presented to the emergency department today because of concerns for chest pain.  Differential would be broad including ACS, pneumonia, PE, dissection, anxiety, esophagitis amongst other etiologies.  Patient had troponin negative x2 here.  Rest of patient's work-up was reassuring.  She did feel some improvement after GI cocktail.  Do wonder if patient is suffering from some esophagitis after alcohol use last night.  I did discuss with patient use of cocaine and alcohol.  Will give patient outpatient follow-up information for anxiety.   ____________________________________________   FINAL CLINICAL IMPRESSION(S) / ED DIAGNOSES  Final diagnoses:  Nonspecific chest pain     Note: This dictation was prepared with Dragon dictation. Any transcriptional errors that result from this process are unintentional     Nance Pear, MD 01/09/19 2356

## 2019-01-09 NOTE — Discharge Instructions (Addendum)
Please seek medical attention for any high fevers, chest pain, shortness of breath, change in behavior, persistent vomiting, bloody stool or any other new or concerning symptoms.  

## 2019-01-09 NOTE — ED Notes (Signed)
EMS pt to registration desk with report of chest pain that started 45 prior to arrival after having a panic attack. EKG by EMS NSR, VS WNL.

## 2019-12-26 ENCOUNTER — Ambulatory Visit: Payer: Medicaid Other | Admitting: Family Medicine

## 2019-12-26 ENCOUNTER — Other Ambulatory Visit: Payer: Self-pay

## 2019-12-26 ENCOUNTER — Encounter: Payer: Self-pay | Admitting: Family Medicine

## 2019-12-26 DIAGNOSIS — Z113 Encounter for screening for infections with a predominantly sexual mode of transmission: Secondary | ICD-10-CM | POA: Diagnosis not present

## 2019-12-26 DIAGNOSIS — N760 Acute vaginitis: Secondary | ICD-10-CM

## 2019-12-26 DIAGNOSIS — B9689 Other specified bacterial agents as the cause of diseases classified elsewhere: Secondary | ICD-10-CM

## 2019-12-26 LAB — WET PREP FOR TRICH, YEAST, CLUE
Trichomonas Exam: NEGATIVE
Yeast Exam: NEGATIVE

## 2019-12-26 MED ORDER — METRONIDAZOLE 500 MG PO TABS: 500.0000 mg | ORAL_TABLET | Freq: Two times a day (BID) | ORAL | 0 refills | Status: AC

## 2019-12-26 NOTE — Progress Notes (Signed)
[facility] Department STI clinic/screening visit  Subjective:  Nicole Banks is a 39 y.o. female being seen today for an STI screening visit. The patient reports they do have symptoms.  Patient reports that they do not desire a pregnancy in the next year.   They reported they are not interested in discussing contraception today.  No LMP recorded. (Menstrual status: IUD).   Patient has the following medical conditions:  There are no problems to display for this patient.   Chief Complaint  Patient presents with  . SEXUALLY TRANSMITTED DISEASE    screening     HPI  Patient reports having symptoms that started a couple days after she used douche.    Last HIV test per patient/review of record was 2019 Patient reports last pap was 2018  See flowsheet for further details and programmatic requirements.    The following portions of the patient's history were reviewed and updated as appropriate: allergies, current medications, past medical history, past social history, past surgical history and problem list.  Objective:  There were no vitals filed for this visit.  Physical Exam Constitutional:      Appearance: Normal appearance.  HENT:     Head: Normocephalic.     Mouth/Throat:     Mouth: Mucous membranes are moist.     Pharynx: Oropharynx is clear. No oropharyngeal exudate or posterior oropharyngeal erythema.     Comments: Teeth missing  Abdominal:     General: Abdomen is flat.     Palpations: Abdomen is soft. There is no mass.     Tenderness: There is no abdominal tenderness. There is no guarding or rebound.  Genitourinary:    Comments: External genitalia without, lice, nits, erythema, edema , lesions or inguinal adenopathy. Vagina with normal mucosa.  White thick discharge in vaginal canal  and pH > 4.  Cervix without visual lesions, uterus firm, mobile, non-tender, no masses, CMT adnexal fullness or tenderness.  Odor present.  Musculoskeletal:     Cervical  back: Normal range of motion and neck supple.  Lymphadenopathy:     Cervical: No cervical adenopathy.  Skin:    General: Skin is warm and dry.     Findings: No bruising, erythema, lesion or rash.  Neurological:     Mental Status: She is alert.  Psychiatric:        Mood and Affect: Mood normal.        Behavior: Behavior normal.      Assessment and Plan:  Nicole Banks is a 39 y.o. female presenting to the [facility] Department for STI screening  1. Screening examination for venereal disease - Chlamydia/Gonorrhea [place] [facility] - HBV Antigen/Antibody [facility] - HIV/HCV [place] [facility] - Syphilis Serology, [place] [facility] - WET PREP FOR TRICH, YEAST, CLUE   Patient accepted all screenings including vaginal CT/GC and bloodwork for HIV/RPR, HBV and HCV.   Patient meets criteria for HepB screening? Yes. Ordered? Yes  No previous testing  Patient meets criteria for HepC screening? Yes. Ordered? Yes no previous testing    Per Wet prep results patient needs treatment for BV.  Patient treated for BV per standing order with Metronidazole 500 mg PO  BID x 7 days.  Discussed time line for [facility] results and that patient will be called with positive results and encouraged patient to call if she had not heard in 2 weeks.  Counseled to return or seek care for continued or worsening symptoms Patient scheduled for PE, IUD  removal and re-insertion on 01/03/20.   Patient counseled to discontinue the use of douche.   Recommended condom use with all sex, condoms given.   Patient is currently using IUD to prevent pregnancy.      No follow-ups on file.    Wendi Snipes, RN

## 2019-12-26 NOTE — Progress Notes (Signed)
Reviewed ERRN documentation for flowsheet and exam note.  Agree that documentation meets STD program requirements/guidelines and standing orders.  I was available for consultation if needed by [facility].  Will sign sign since [facility] not cleared to sign documents yet.

## 2019-12-28 ENCOUNTER — Ambulatory Visit: Payer: Self-pay

## 2019-12-30 LAB — HEPATITIS B SURFACE ANTIGEN

## 2019-12-30 LAB — HM HEPATITIS C SCREENING LAB: HM Hepatitis Screen: NEGATIVE

## 2019-12-30 LAB — HM HIV SCREENING LAB: HM HIV Screening: NEGATIVE

## 2020-01-02 ENCOUNTER — Encounter: Payer: Self-pay | Admitting: Family Medicine

## 2020-01-03 ENCOUNTER — Ambulatory Visit: Payer: Self-pay

## 2020-02-03 ENCOUNTER — Encounter: Payer: Self-pay | Admitting: Emergency Medicine

## 2020-02-03 ENCOUNTER — Other Ambulatory Visit: Payer: Self-pay

## 2020-02-03 ENCOUNTER — Emergency Department
Admission: EM | Admit: 2020-02-03 | Discharge: 2020-02-03 | Disposition: A | Discharge status: Home / Self Care | Payer: Medicaid Other | Attending: Emergency Medicine | Admitting: Emergency Medicine

## 2020-02-03 DIAGNOSIS — F17200 Nicotine dependence, unspecified, uncomplicated: Secondary | ICD-10-CM | POA: Diagnosis not present

## 2020-02-03 DIAGNOSIS — B373 Candidiasis of vulva and vagina: Secondary | ICD-10-CM | POA: Insufficient documentation

## 2020-02-03 DIAGNOSIS — B3731 Acute candidiasis of vulva and vagina: Secondary | ICD-10-CM

## 2020-02-03 DIAGNOSIS — R300 Dysuria: Secondary | ICD-10-CM | POA: Diagnosis present

## 2020-02-03 DIAGNOSIS — N3000 Acute cystitis without hematuria: Secondary | ICD-10-CM | POA: Diagnosis not present

## 2020-02-03 LAB — URINALYSIS, COMPLETE (UACMP) WITH MICROSCOPIC
Bilirubin Urine: NEGATIVE
Glucose, UA: NEGATIVE mg/dL
Hgb urine dipstick: NEGATIVE
Ketones, ur: NEGATIVE mg/dL
Nitrite: NEGATIVE
Protein, ur: NEGATIVE mg/dL
Specific Gravity, Urine: 1.023 (ref 1.005–1.030)
WBC, UA: >50 WBC/hpf — ABNORMAL HIGH (ref 0–5)
pH: 6 (ref 5.0–8.0)

## 2020-02-03 LAB — POCT PREGNANCY, URINE: Preg Test, Ur: NEGATIVE

## 2020-02-03 MED ORDER — FLUCONAZOLE 150 MG PO TABS: 150.0000 mg | ORAL_TABLET | Freq: Every day | ORAL | 0 refills | Status: AC

## 2020-02-03 MED ORDER — METRONIDAZOLE 0.75 % EX GEL: 1.0000 "application " | Freq: Two times a day (BID) | CUTANEOUS | 0 refills | Status: AC

## 2020-02-03 MED ORDER — CEPHALEXIN 500 MG PO CAPS: 500.0000 mg | ORAL_CAPSULE | Freq: Two times a day (BID) | ORAL | 0 refills | Status: AC

## 2020-02-03 NOTE — ED Provider Notes (Signed)
[facility] Emergency Department Provider Note ____________________________________________   First MD Initiated Contact with Patient 02/03/20 1221     (approximate)  I have reviewed the triage vital signs and the nursing notes.   HISTORY  Chief Complaint Dysuria  HPI Nicole Banks is a 39 y.o. female with no chronic medical history presents to the emergency department for treatment and evaluation of dysuria, left side back pain, and malodorous discharge. She states that she usually gets a UTI this time each year after her birthday. Symptoms started about a week ago. No fever. She was tested for STD last week and was negative, but now believes she has BV after changing soap.     History reviewed. No pertinent past medical history.  There are no problems to display for this patient.   Past Surgical History:  Procedure Laterality Date  . CESAREAN SECTION     x 2 deliveries    Prior to Admission medications   Medication Sig Start Date End Date Taking? Authorizing Provider  cephALEXin (KEFLEX) 500 MG capsule Take 1 capsule (500 mg total) by mouth 2 (two) times daily for 7 days. 02/03/20 02/10/20  Brooklyn Jeff, Kasandra Knudsen, FNP  cyclobenzaprine (FLEXERIL) 10 MG tablet Take 1 tablet (10 mg total) by mouth 3 (three) times daily as needed for muscle spasms. 07/22/17   Merrily Brittle, MD  famotidine (PEPCID) 20 MG tablet Take 1 tablet (20 mg total) by mouth daily. 01/09/19 01/09/20  Phineas Semen, MD  fluconazole (DIFLUCAN) 150 MG tablet Take 1 tablet (150 mg total) by mouth daily. 02/03/20   Angla Delahunt B, FNP  ibuprofen (ADVIL) 600 MG tablet Take 1 tablet (600 mg total) by mouth every 6 (six) hours as needed. 08/31/18   Nita Sickle, MD  meloxicam (MOBIC) 15 MG tablet Take 1 tablet (15 mg total) by mouth daily. 08/08/17   Cuthriell, Delorise Royals, PA-C  metroNIDAZOLE (METROGEL) 0.75 % gel Apply 1 application topically 2 (two) times daily. 02/03/20   Chinita Pester, FNP    Allergies Patient has no known allergies.  History reviewed. No pertinent family history.  Social History Social History   Tobacco Use  . Smoking status: Current Some Day Smoker    Packs/day: 0.50  . Smokeless tobacco: Never Used  . Tobacco comment: smokes when drinking   Vaping Use  . Vaping Use: Some days  . Substances: Nicotine, Flavoring  Substance Use Topics  . Alcohol use: Yes    Alcohol/week: 12.0 standard drinks    Types: 12 Cans of beer per week    Comment: on weekends   . Drug use: Not Currently    Types: Marijuana    Review of Systems  Constitutional: No fever/chills Eyes: No visual changes. ENT: No sore throat. Cardiovascular: Denies chest pain. Respiratory: Denies shortness of breath. Gastrointestinal: No abdominal pain.  No nausea, no vomiting.  No diarrhea.  No constipation. Genitourinary: Positive for dysuria. Musculoskeletal: Positive for left side back pain. Skin: Negative for rash. Neurological: Negative for headaches, focal weakness or numbness. ___________________________________________   PHYSICAL EXAM:  VITAL SIGNS: ED Triage Vitals  Enc Vitals Group     BP 02/03/20 0943 125/71     Pulse Rate 02/03/20 0943 66     Resp 02/03/20 0943 18     Temp 02/03/20 0943 98.3 F (36.8 C)     Temp Source 02/03/20 0943 Oral     SpO2 02/03/20 0943 100 %     Weight 02/03/20 0945 147 lb (  66.7 kg)     Height 02/03/20 0945 5\' 11"  (1.803 m)     Head Circumference --      Peak Flow --      Pain Score 02/03/20 0944 8     Pain Loc --      Pain Edu? --      Excl. in GC? --     Constitutional: Alert and oriented. Well appearing and in no acute distress. Eyes: Conjunctivae are normal.  Head: Atraumatic. Nose: No congestion/rhinnorhea. Mouth/Throat: Mucous membranes are moist. Neck: No stridor.   Cardiovascular: Normal rate, regular rhythm. Grossly normal heart sounds.   Respiratory: Normal respiratory effort.  No retractions. Lungs  CTAB. Gastrointestinal: Soft and nontender. No distention. No CVA tenderness. Genitourinary: Suprapubic tenderness. Musculoskeletal: No lower extremity tenderness nor edema.  No joint effusions. Neurologic:  Normal speech and language. No gross focal neurologic deficits are appreciated. No gait instability. Skin:  Skin is warm, dry and intact. Psychiatric: Mood and affect are normal. Speech and behavior are normal.  ____________________________________________   LABS (all labs ordered are listed, but only abnormal results are displayed)  Labs Reviewed  URINALYSIS, COMPLETE (UACMP) WITH MICROSCOPIC - Abnormal; Notable for the following components:      Result Value   Color, Urine YELLOW (*)    APPearance HAZY (*)    Leukocytes,Ua MODERATE (*)    WBC, UA >50 (*)    Bacteria, UA RARE (*)    All other components within normal limits  POCT PREGNANCY, URINE   ____________________________________________  EKG  Not indicated. ____________________________________________  RADIOLOGY  ED MD interpretation:    Not indicated. I, 02/05/20, personally viewed and evaluated these images (plain radiographs) as part of my medical decision making, as well as reviewing the written report by the radiologist.  Official radiology report(s): No results found.  ____________________________________________   PROCEDURES  Procedure(s) performed (including Critical Care):  Procedures  ____________________________________________   INITIAL IMPRESSION / ASSESSMENT AND PLAN     39 year old female presenting to the emergency department for treatment and evaluation of dysuria with malodorous vaginal discharge has been present for the past week.  See HPI for further details.  Plan will be to review urinalysis collected while awaiting ER room assignment.  DIFFERENTIAL DIAGNOSIS  Acute cystitis, bacterial vaginosis, STI, PID  ED COURSE  Urinalysis is consistent with acute cystitis.   Also noted in the urinalysis results is budding yeast.  Patient is requesting to also be treated for bacterial vaginosis as she is certain that she has it based on previous experience.  Patient was encouraged to follow-up with primary care provider or the health department for symptoms that do not seem to be improving over the next few days.  She will be treated with Keflex, Monistat, and Flagyl.    ___________________________________________   FINAL CLINICAL IMPRESSION(S) / ED DIAGNOSES  Final diagnoses:  Acute cystitis without hematuria  Vaginal yeast infection     ED Discharge Orders         Ordered    metroNIDAZOLE (METROGEL) 0.75 % gel  2 times daily        02/03/20 1237    fluconazole (DIFLUCAN) 150 MG tablet  Daily        02/03/20 1237    cephALEXin (KEFLEX) 500 MG capsule  2 times daily        02/03/20 1237           Nicole Banks was evaluated in Emergency Department on 02/03/2020 for the symptoms  described in the history of present illness. She was evaluated in the context of the global COVID-19 pandemic, which necessitated consideration that the patient might be at risk for infection with the SARS-CoV-2 virus that causes COVID-19. Institutional protocols and algorithms that pertain to the evaluation of patients at risk for COVID-19 are in a state of rapid change based on information released by regulatory bodies including the CDC and federal and state organizations. These policies and algorithms were followed during the patient's care in the ED.   Note:  This document was prepared using Dragon voice recognition software and may include unintentional dictation errors.   Chinita Pester, FNP 02/03/20 1238    Sharman Cheek, MD 02/03/20 1520

## 2020-02-03 NOTE — Discharge Instructions (Signed)
Please follow-up with your primary care provider or the health department for symptoms that are not improving over the next few days.  Return to the emergency department for symptoms of change or worsen if unable to schedule appointment.

## 2020-02-03 NOTE — ED Triage Notes (Signed)
Pt presents to ED via POV with c/o UTI. Pt states hx of same. Pt states she also thinks she has BV due to using a different soap. Pt states burning with urination. Pt also c/o HA. Pt states symptoms x 1 week.   Pt requesting something for pain due the "200mg  Ibuprofen [she] has isn't going to do anything for it", also requesting an oitnment or gel for treatment of the BV due to not taking pills very well. This RN explained would need eval by EDP.

## 2021-09-04 IMAGING — CR DG CHEST 2V
2 series · 2 of 2 positions shown · non-contrast
Comparison: None.

CLINICAL DATA: Chest pain

EXAM:
CHEST - 2 VIEW

[chest pa]
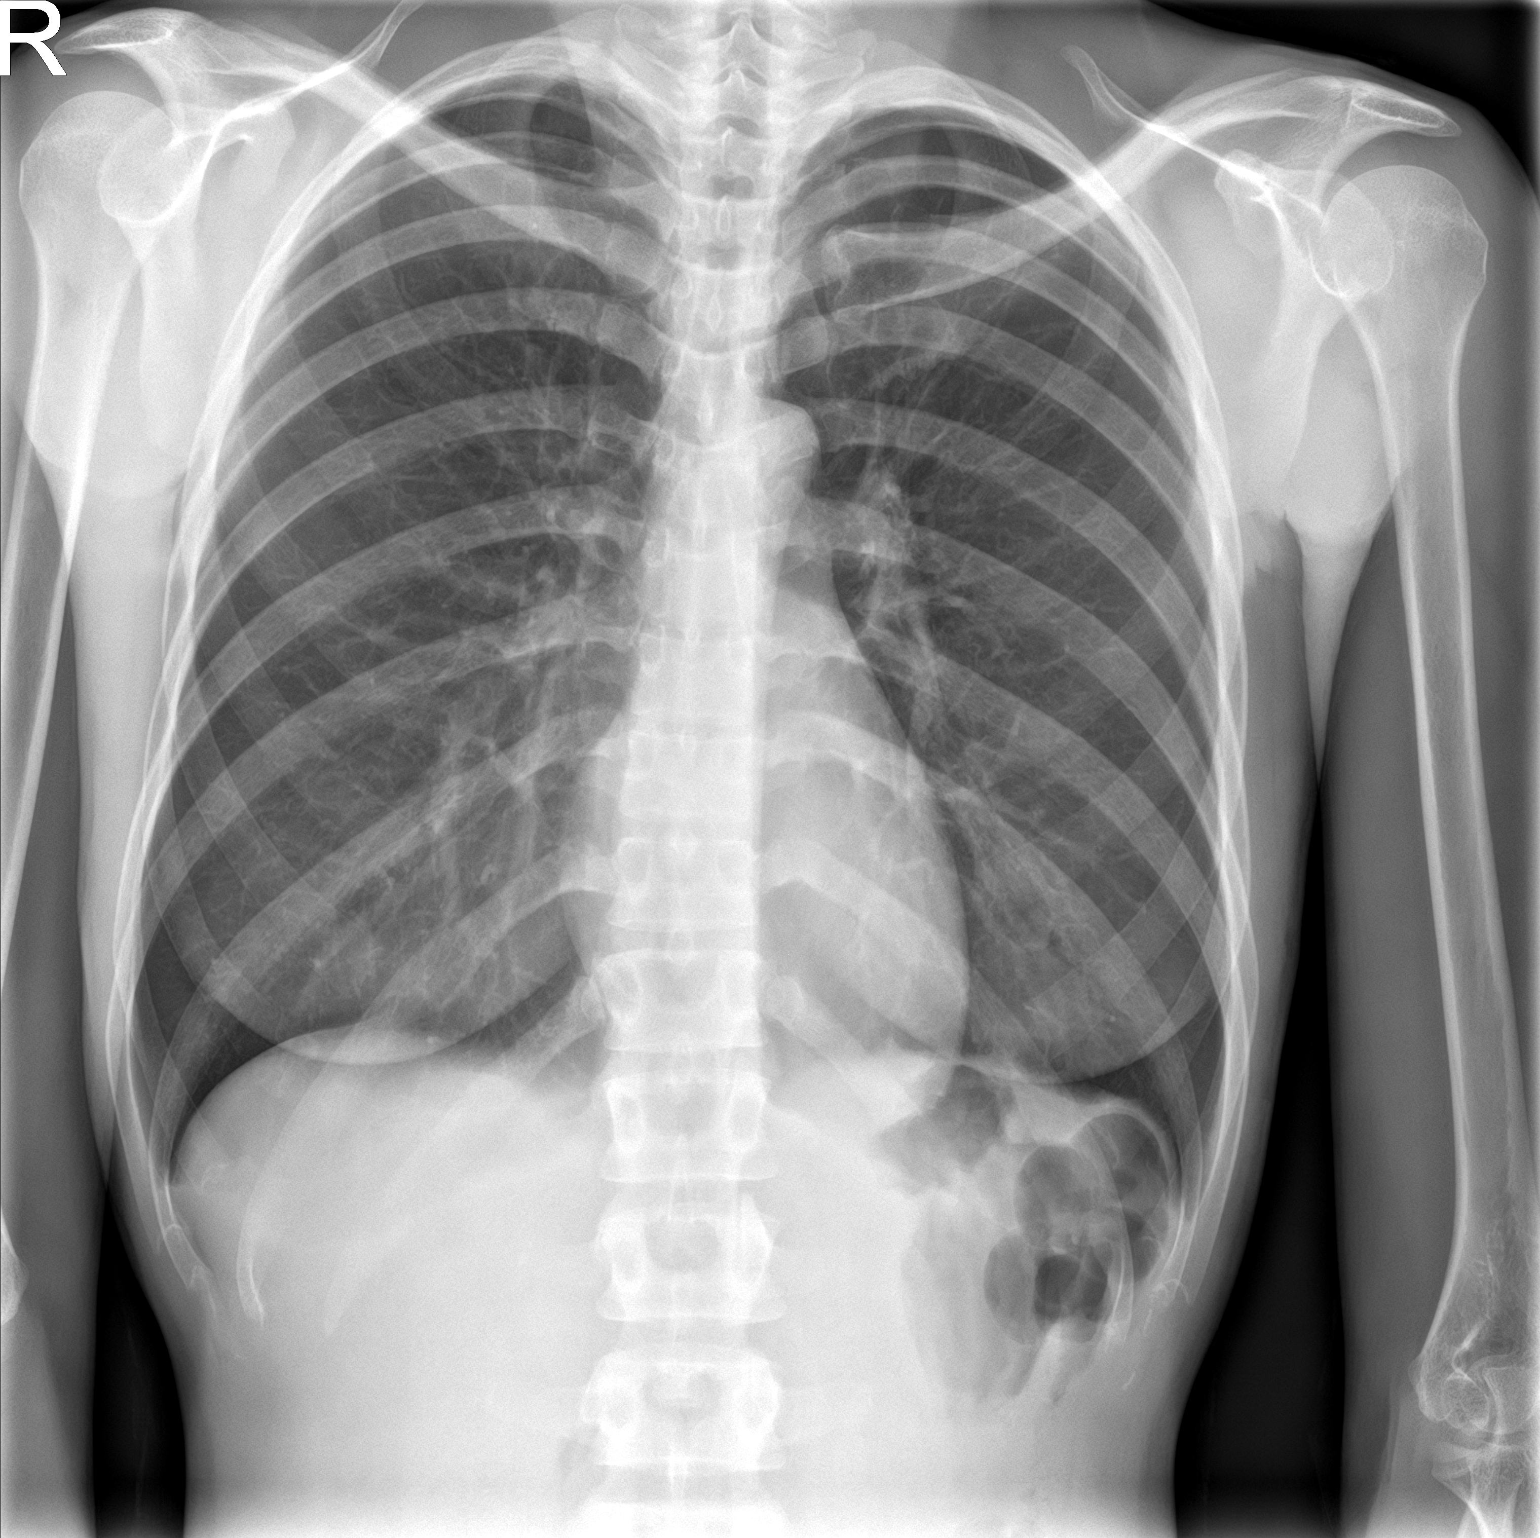

[chest lat]
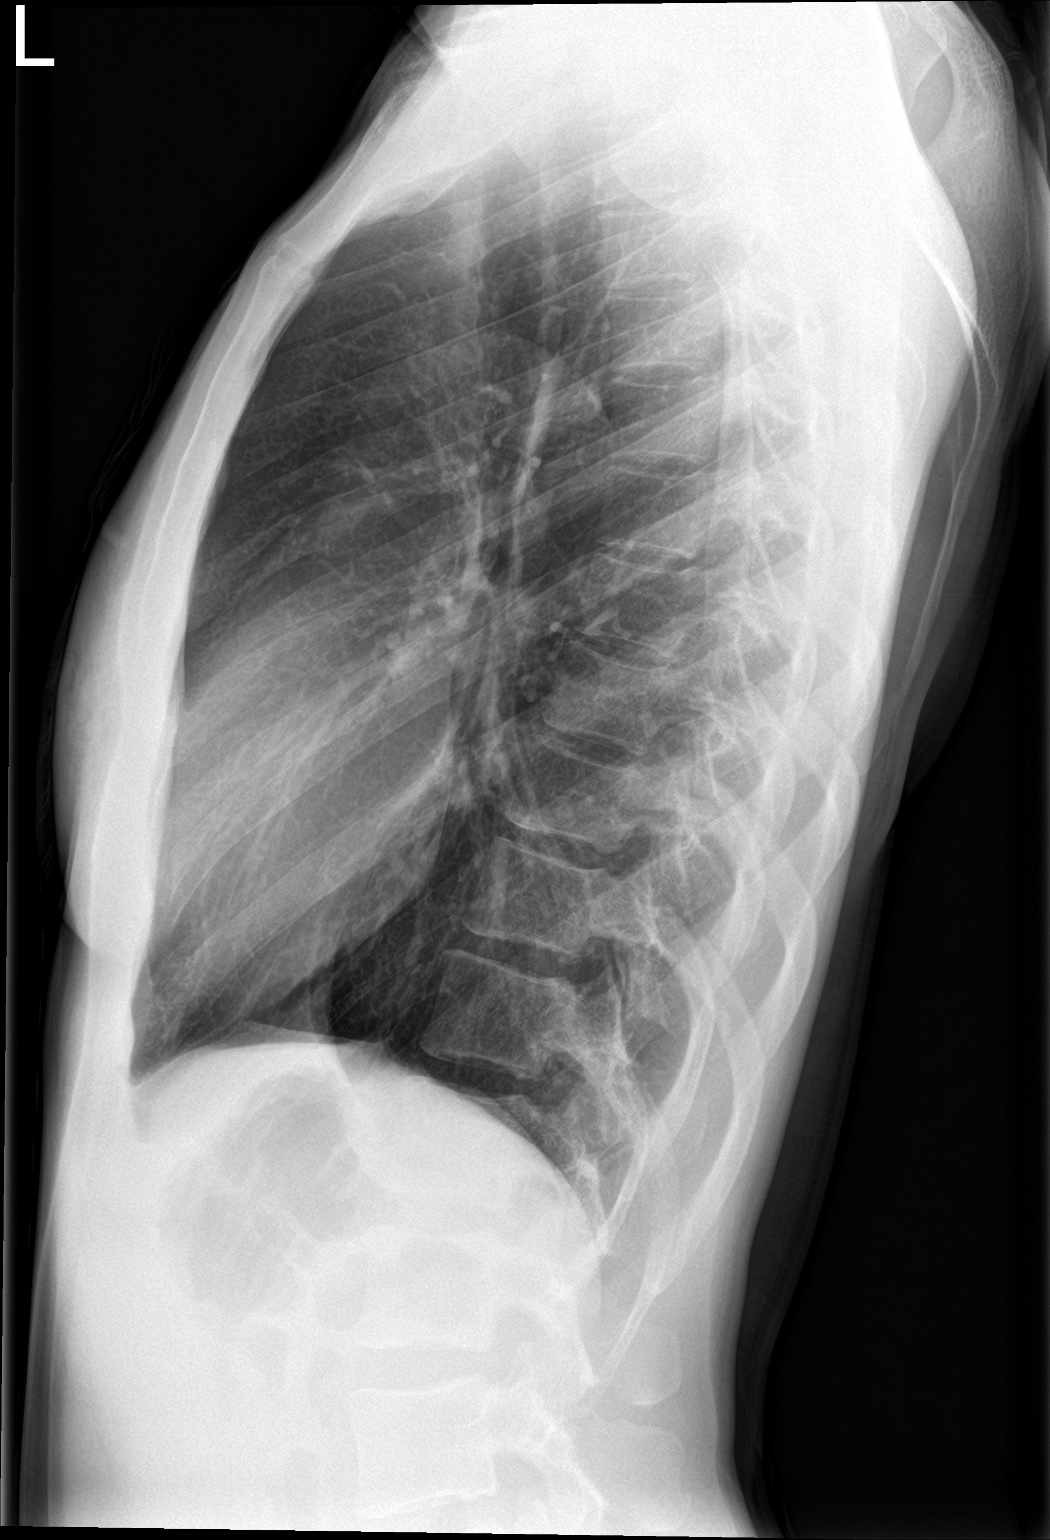

[2 of 2 positions shown; findings below may reference images not displayed]

FINDINGS: The heart size and mediastinal contours are within normal limits.
Both lungs are clear. The visualized skeletal structures are
unremarkable.
IMPRESSION: No active cardiopulmonary disease.
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
